# Patient Record
Sex: Female | Born: 1965 | Race: Black or African American | Hispanic: No | Marital: Single | Smoking: Never smoker
Health system: Southern US, Community
[De-identification: ages and names within clinical notes are randomized; demographics above are authoritative.]

## PROBLEM LIST (undated history)

## (undated) DIAGNOSIS — M1990 Unspecified osteoarthritis, unspecified site: Secondary | ICD-10-CM

---

## 1999-10-22 ENCOUNTER — Encounter: Payer: Self-pay | Admitting: Emergency Medicine

## 1999-10-22 ENCOUNTER — Emergency Department (HOSPITAL_COMMUNITY): Admission: EM | Admit: 1999-10-22 | Discharge: 1999-10-22 | Payer: Self-pay | Admitting: Emergency Medicine

## 2004-06-10 DIAGNOSIS — M1990 Unspecified osteoarthritis, unspecified site: Secondary | ICD-10-CM

## 2004-06-10 HISTORY — DX: Unspecified osteoarthritis, unspecified site: M19.90

## 2005-04-15 ENCOUNTER — Emergency Department (HOSPITAL_COMMUNITY): Admission: EM | Admit: 2005-04-15 | Discharge: 2005-04-15 | Payer: Self-pay | Admitting: Emergency Medicine

## 2005-04-19 ENCOUNTER — Ambulatory Visit (HOSPITAL_COMMUNITY): Admission: RE | Admit: 2005-04-19 | Discharge: 2005-04-19 | Payer: Self-pay | Admitting: Emergency Medicine

## 2005-04-19 ENCOUNTER — Emergency Department (HOSPITAL_COMMUNITY): Admission: EM | Admit: 2005-04-19 | Discharge: 2005-04-19 | Payer: Self-pay | Admitting: Emergency Medicine

## 2012-01-01 ENCOUNTER — Emergency Department (HOSPITAL_COMMUNITY): Payer: Self-pay

## 2012-01-01 ENCOUNTER — Encounter (HOSPITAL_COMMUNITY): Payer: Self-pay | Admitting: *Deleted

## 2012-01-01 ENCOUNTER — Encounter (HOSPITAL_COMMUNITY): Payer: Self-pay

## 2012-01-01 ENCOUNTER — Emergency Department (HOSPITAL_COMMUNITY)
Admission: EM | Admit: 2012-01-01 | Discharge: 2012-01-01 | Disposition: A | Discharge status: Home / Self Care | Payer: Self-pay | Attending: Emergency Medicine | Admitting: Emergency Medicine

## 2012-01-01 ENCOUNTER — Emergency Department (INDEPENDENT_AMBULATORY_CARE_PROVIDER_SITE_OTHER)
Admission: EM | Admit: 2012-01-01 | Discharge: 2012-01-01 | Disposition: A | Discharge status: Nursing Home (Includes ICF) | Payer: Self-pay | Source: Home / Self Care

## 2012-01-01 DIAGNOSIS — R0602 Shortness of breath: Secondary | ICD-10-CM | POA: Insufficient documentation

## 2012-01-01 DIAGNOSIS — M76899 Other specified enthesopathies of unspecified lower limb, excluding foot: Secondary | ICD-10-CM | POA: Insufficient documentation

## 2012-01-01 DIAGNOSIS — M79659 Pain in unspecified thigh: Secondary | ICD-10-CM

## 2012-01-01 DIAGNOSIS — R220 Localized swelling, mass and lump, head: Secondary | ICD-10-CM | POA: Insufficient documentation

## 2012-01-01 DIAGNOSIS — M79609 Pain in unspecified limb: Secondary | ICD-10-CM

## 2012-01-01 DIAGNOSIS — M7070 Other bursitis of hip, unspecified hip: Secondary | ICD-10-CM

## 2012-01-01 HISTORY — DX: Unspecified osteoarthritis, unspecified site: M19.90

## 2012-01-01 LAB — POCT I-STAT, CHEM 8
Calcium, Ion: 1.16 mmol/L (ref 1.12–1.23)
Creatinine, Ser: 1 mg/dL (ref 0.50–1.10)
Glucose, Bld: 96 mg/dL (ref 70–99)
HCT: 42 % (ref 36.0–46.0)
Hemoglobin: 14.3 g/dL (ref 12.0–15.0)

## 2012-01-01 MED ORDER — PREDNISONE 20 MG PO TABS
60.0000 mg | ORAL_TABLET | Freq: Once | ORAL | Status: AC
  Administered 2012-01-01: 60 mg via ORAL
  Filled 2012-01-01: qty 3

## 2012-01-01 MED ORDER — PREDNISONE 20 MG PO TABS: ORAL_TABLET | ORAL | Status: AC

## 2012-01-01 NOTE — ED Provider Notes (Signed)
History     CSN: [id number]  Arrival date & time 01/01/12  1718   None     Chief Complaint  Patient presents with  . Leg Pain    (Consider location/radiation/quality/duration/timing/severity/associated sxs/prior treatment) Patient is a 46 y.o. female presenting with leg pain. The history is provided by the patient.  Leg Pain   Patient complains of right lateral thigh pain ongoing since 2006 but has gotten increasingly worse this week.  Previously considered to have a DVT but for some reason she never had doppler studies completed as ordered.  Reports pain is worse with palpation, associated lower extremity edema, no pain with ambulation.  Denies neurovascular symptoms, no change in leg function, but admits to ambulating with limp.  History reviewed. No pertinent past medical history.  History reviewed. No pertinent past surgical history.  No family history on file.  History  Substance Use Topics  . Smoking status: Never Smoker   . Smokeless tobacco: Not on file  . Alcohol Use: No    OB History    Grav Para Term Preterm Abortions TAB SAB Ect Mult Living                  Review of Systems  All other systems reviewed and are negative.    Allergies  Aspirin and Codeine  Home Medications  No current outpatient prescriptions on file.  BP 199/118  Pulse 96  Temp 97.9 F (36.6 C) (Oral)  Resp 16  SpO2 100%  LMP 12/29/2011  Physical Exam  Nursing note and vitals reviewed. Constitutional: She is oriented to person, place, and time. Vital signs are normal. She appears well-developed and well-nourished. She is active and cooperative.  HENT:  Head: Normocephalic.  Eyes: Conjunctivae are normal. Pupils are equal, round, and reactive to light. No scleral icterus.  Neck: Trachea normal. Neck supple.  Cardiovascular: Normal rate and regular rhythm.   Pulmonary/Chest: Effort normal and breath sounds normal.  Musculoskeletal:       Right hip: Normal.       Right  knee: Normal.       Left knee: Normal.       Right ankle: She exhibits swelling.       Left ankle: She exhibits swelling.       Right upper leg: She exhibits tenderness.       Right lower leg: She exhibits edema.       Left lower leg: She exhibits edema.       Tenderness with  Superficial thrombo/cordlike vein on right lateral thigh, nonpitting edema in lower extremities, rubor, sensation intact distally  Neurological: She is alert and oriented to person, place, and time. No cranial nerve deficit or sensory deficit. GCS eye subscore is 4. GCS verbal subscore is 5. GCS motor subscore is 6.  Skin: Skin is warm and dry.  Psychiatric: She has a normal mood and affect. Her speech is normal and behavior is normal. Judgment and thought content normal. Cognition and memory are normal.    ED Course  Procedures (including critical care time)  Labs Reviewed - No data to display No results found.   1. Thigh pain       MDM  Cannot rule out DVT, transfer to [facility] for further evaluation.        Johnsie Kindred, NP 01/01/12 1936

## 2012-01-01 NOTE — ED Provider Notes (Addendum)
History     CSN: [id number]  Arrival date & time 01/01/12  1958   None     Chief Complaint  Patient presents with  . Leg Pain    (Consider location/radiation/quality/duration/timing/severity/associated sxs/prior treatment) HPI Comments: Patient states, that for the past 5, years.  She's had posterior right thigh.  Pain.  That radiates laterally, and anterior.  She was seen 5 years ago for this and told it was joint inflammation and prescribed.  Aleve, for, which she's been taking every day.  Since, then she's noticed in the past, "little bit" but the pain is getting worse is constant but made worse with ambulation.  She denies back pain.  Knee pain, recent surgeries, recent travel, shortness of breath but she did note that on Saturday, which was 4, days ago, that she had bilateral foot swelling.  That lasted one day and facial swelling.  When she first awakens in the morning.  That resolved by noon.  She had no further episodes of swelling.  She does not endorse, shortness of breath, chest pain, weight gain or weight loss.  Traumatic injury.   Patient is a 46 y.o. female presenting with leg pain. The history is provided by the patient.  Leg Pain  The incident occurred more than 1 week ago. There was no injury mechanism. The pain is present in the right thigh. The pain is at a severity of 6/10. The pain is moderate. The pain has been constant since onset. Pertinent negatives include no numbness, no inability to bear weight, no loss of motion, no muscle weakness and no loss of sensation.    Past Medical History  Diagnosis Date  . Joint inflammation 2006    History reviewed. No pertinent past surgical history.  No family history on file.  History  Substance Use Topics  . Smoking status: Never Smoker   . Smokeless tobacco: Not on file  . Alcohol Use: No    OB History    Grav Para Term Preterm Abortions TAB SAB Ect Mult Living                  Review of Systems    Constitutional: Positive for activity change. Negative for fever.  Musculoskeletal: Positive for joint swelling. Negative for myalgias.  Skin: Negative for rash and wound.  Neurological: Negative for numbness.    Allergies  Aspirin and Codeine  Home Medications   Current Outpatient Rx  Name Route Sig Dispense Refill  . NAPROXEN SODIUM 220 MG PO TABS Oral Take 220 mg by mouth daily as needed. For joint pain    . PREDNISONE 20 MG PO TABS  3 Tabs PO Days 1-3, then 2 tabs PO Days 4-6, then 1 tab PO Day 7-9, then Half Tab PO Day 10-12 20 tablet 0    BP 163/102  Pulse 91  Temp 98.3 F (36.8 C) (Oral)  Resp 16  SpO2 81%  LMP 12/29/2011  Physical Exam  Constitutional: She appears well-developed.  HENT:  Head: Normocephalic.  Neck: Normal range of motion.  Cardiovascular: Normal rate.   Pulmonary/Chest: Effort normal.  Abdominal: Soft.  Musculoskeletal: Normal range of motion.       Pain over R hip bursitis  Neurological: She is alert.  Skin: Skin is warm.    ED Course  Procedures (including critical care time)   Labs Reviewed  POCT I-STAT, CHEM 8   Dg Chest 2 View  01/01/2012  *RADIOLOGY REPORT*  Clinical Data: Shortness of breath, facial  swelling  CHEST - 2 VIEW  Comparison: None.  Findings: Lungs are essentially clear. No pleural effusion or pneumothorax.  Cardiomediastinal silhouette is within normal limits.  Degenerative changes of the visualized thoracolumbar spine.  IMPRESSION: No evidence of acute cardiopulmonary disease.  Original Report Authenticated By: Charline Bills, M.D.   Dg Femur Right  01/01/2012  *RADIOLOGY REPORT*  Clinical Data: Right femur pain  RIGHT FEMUR - 2 VIEW  Comparison: 04/15/2005  Findings: No fracture or dislocation is seen.  Mild to moderate degenerative changes of the right hip.  Visualized soft tissues are unremarkable.  IMPRESSION: No fracture or dislocation is seen.  Mild to moderate degenerative changes of the right hip.  Original  Report Authenticated By: Charline Bills, M.D.     1. Bursitis of hip       MDM  I have reviewed the X-rays, which show degenerative changes in the hip.  Her exam is consistent with a bursitis Her kidney function, and chest x-ray, are normal       Arman Filter, NP 01/01/12 2305  Arman Filter, NP 01/01/12 2305  Arman Filter, NP 01/15/12 2006

## 2012-01-01 NOTE — ED Notes (Signed)
Patient transported to X-ray 

## 2012-01-01 NOTE — ED Notes (Addendum)
C/o rt posterior thigh pain.  States this has been bothering her since 2006. States she was seen in ED for this 04/2005 and told it was inflammation.  Reports she needs to work but pain is worse with ambulation and standing.  Denies recent injury.  BP is elevated today- denies hx of HTN.  Denies chest pain, SOB, dizziness or headache.  States she has had some swelling of her feet and ankles recently.

## 2012-01-01 NOTE — ED Notes (Signed)
Pt was dx in [facility] with "joint inflammation" in 2006 r/t R thigh.  Pt has been taking aleve since then.  Pt went to UC today b/c the pain began getting unbearable and her feet started swelling Sat.  Presently no pedal edema.  Pt denies sob.

## 2012-01-02 NOTE — ED Provider Notes (Signed)
Medical screening examination/treatment/procedure(s) were performed by non-physician practitioner and as supervising physician I was immediately available for consultation/collaboration.   Carleene Cooper III, MD 01/02/12 [phone]

## 2012-01-02 NOTE — ED Provider Notes (Signed)
Medical screening examination/treatment/procedure(s) were performed by non-physician practitioner and as supervising physician I was immediately available for consultation/collaboration.Case was transferred to ED.   Raynald Blend, MD 01/02/12 1034

## 2012-01-15 NOTE — ED Provider Notes (Signed)
Medical screening examination/treatment/procedure(s) were performed by non-physician practitioner and as supervising physician I was immediately available for consultation/collaboration.   Carleene Cooper III, MD 01/15/12 2157

## 2014-03-14 IMAGING — CR DG CHEST 2V
2 series · 2 of 2 positions shown · non-contrast
Comparison: None.

CLINICAL DATA: Shortness of breath, facial swelling

CHEST - 2 VIEW

[w chest pa]
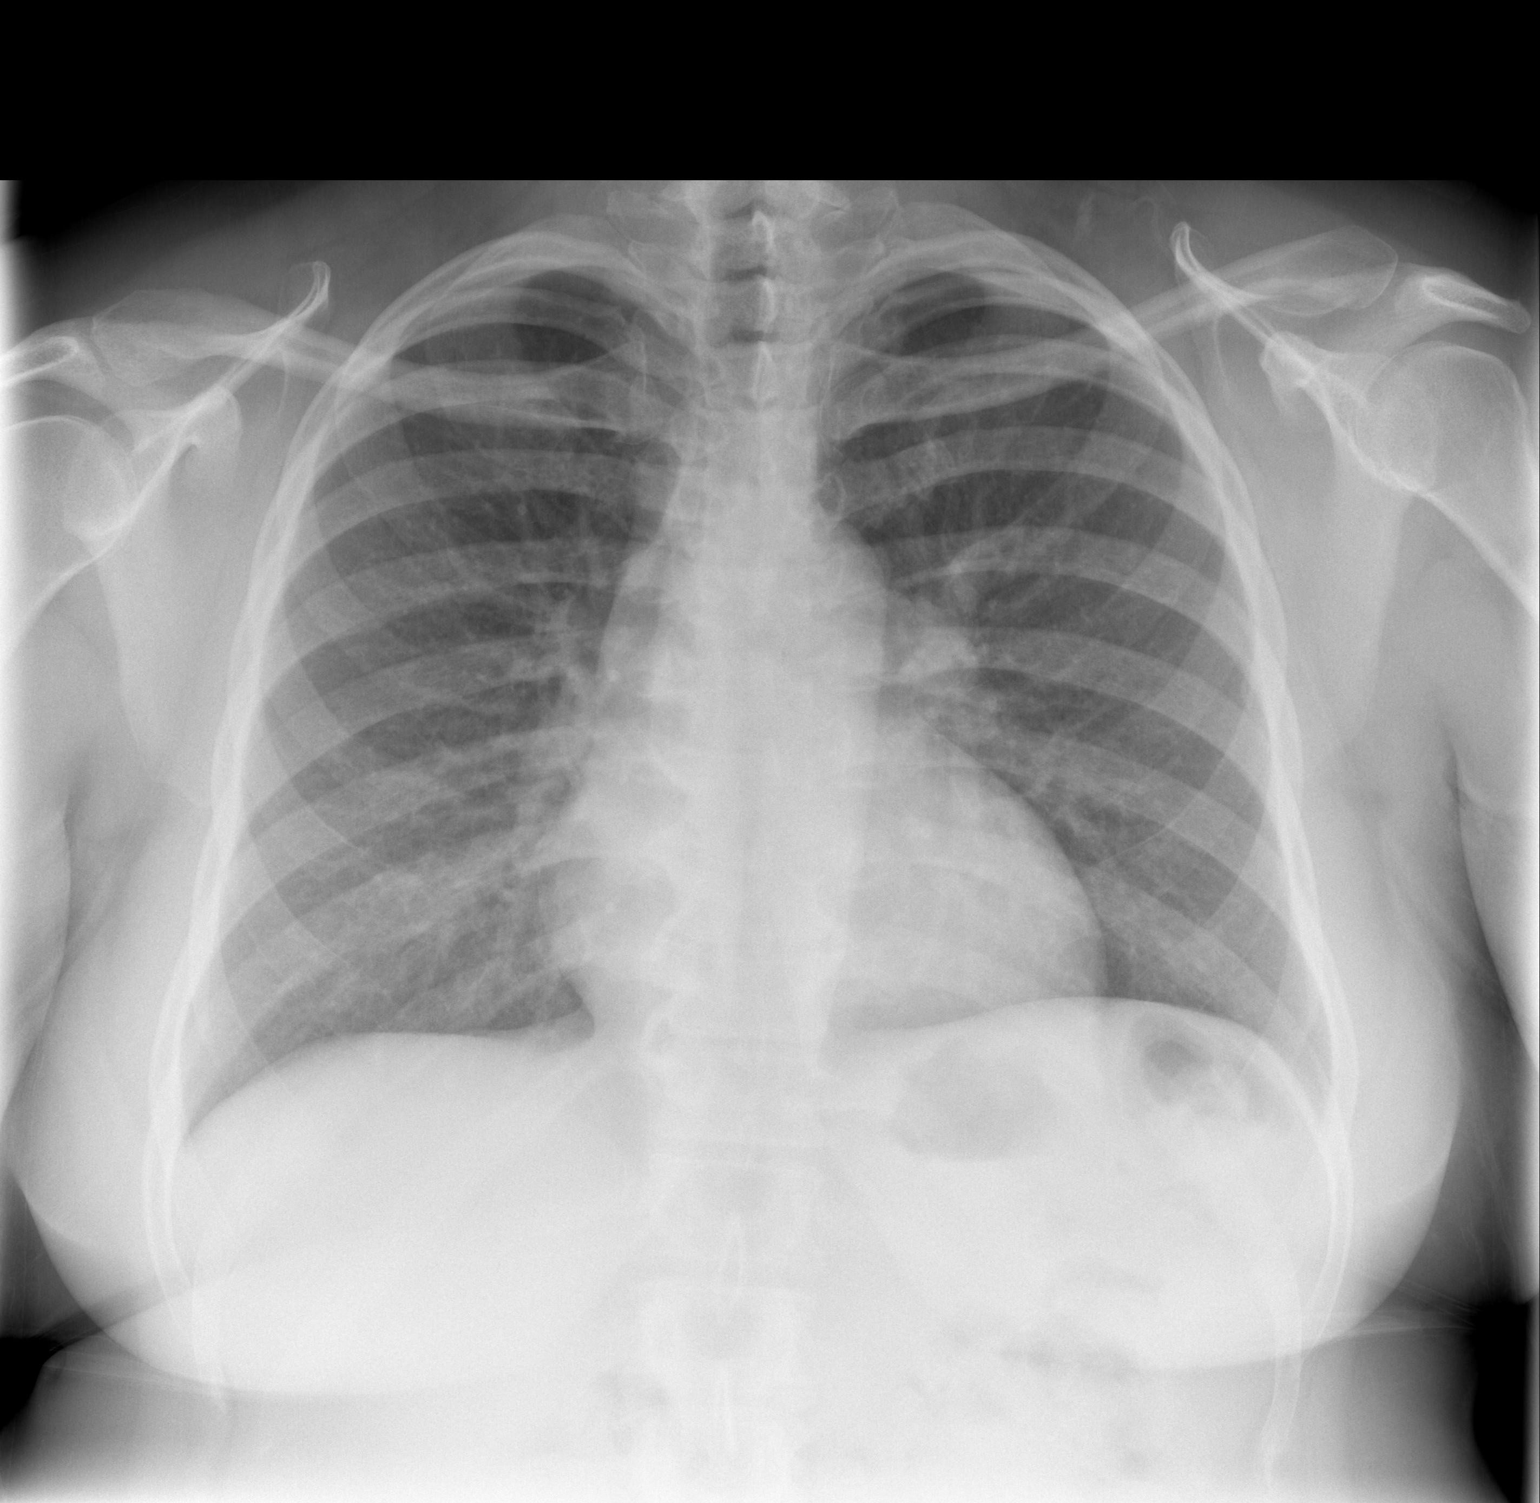

[w chest lat]
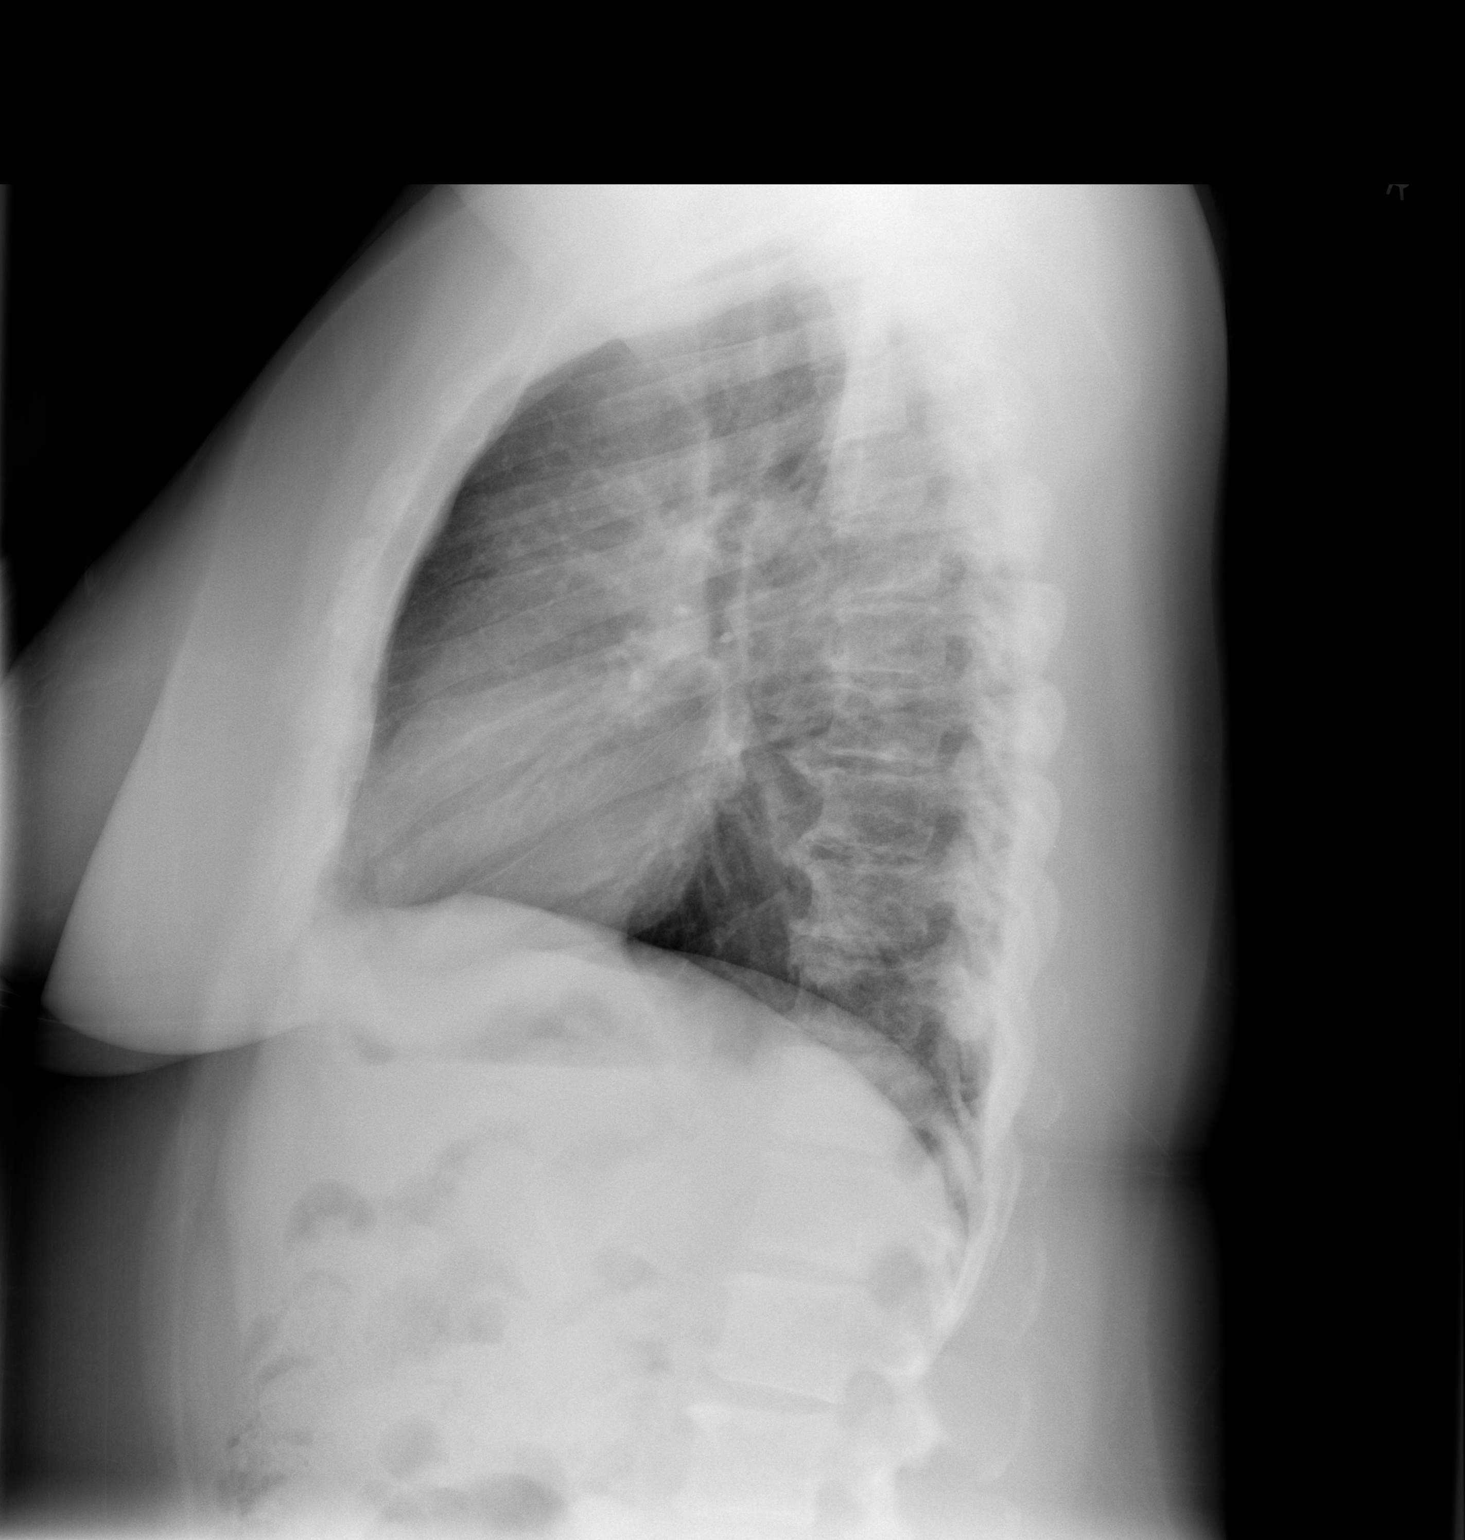

[2 of 2 positions shown; findings below may reference images not displayed]

FINDINGS: Lungs are essentially clear. No pleural effusion or
pneumothorax.

Cardiomediastinal silhouette is within normal limits.

Degenerative changes of the visualized thoracolumbar spine.
IMPRESSION: No evidence of acute cardiopulmonary disease.

## 2014-03-15 ENCOUNTER — Emergency Department (HOSPITAL_COMMUNITY): Payer: Self-pay

## 2014-03-15 ENCOUNTER — Emergency Department (HOSPITAL_COMMUNITY)
Admission: EM | Admit: 2014-03-15 | Discharge: 2014-03-15 | Disposition: A | Discharge status: Home / Self Care | Payer: Self-pay | Attending: Emergency Medicine | Admitting: Emergency Medicine

## 2014-03-15 ENCOUNTER — Encounter (HOSPITAL_COMMUNITY): Payer: Self-pay | Admitting: Emergency Medicine

## 2014-03-15 DIAGNOSIS — Y939 Activity, unspecified: Secondary | ICD-10-CM | POA: Insufficient documentation

## 2014-03-15 DIAGNOSIS — Z791 Long term (current) use of non-steroidal anti-inflammatories (NSAID): Secondary | ICD-10-CM | POA: Insufficient documentation

## 2014-03-15 DIAGNOSIS — M7071 Other bursitis of hip, right hip: Secondary | ICD-10-CM | POA: Insufficient documentation

## 2014-03-15 DIAGNOSIS — M719 Bursopathy, unspecified: Secondary | ICD-10-CM

## 2014-03-15 LAB — I-STAT CHEM 8, ED
BUN: 8 mg/dL (ref 6–23)
CHLORIDE: 102 meq/L (ref 96–112)
CREATININE: 0.8 mg/dL (ref 0.50–1.10)
Calcium, Ion: 1.2 mmol/L (ref 1.12–1.23)
Glucose, Bld: 103 mg/dL — ABNORMAL HIGH (ref 70–99)
HCT: 42 % (ref 36.0–46.0)
HEMOGLOBIN: 14.3 g/dL (ref 12.0–15.0)
POTASSIUM: 3.4 meq/L — AB (ref 3.7–5.3)
SODIUM: 138 meq/L (ref 137–147)
TCO2: 24 mmol/L (ref 0–100)

## 2014-03-15 MED ORDER — HYDROCODONE-ACETAMINOPHEN 5-325 MG PO TABS
1.0000 | ORAL_TABLET | ORAL | Status: AC | PRN
Start: 2014-03-15 — End: ?

## 2014-03-15 MED ORDER — HYDROCODONE-ACETAMINOPHEN 5-325 MG PO TABS
1.0000 | ORAL_TABLET | Freq: Once | ORAL | Status: AC
  Administered 2014-03-15: 1 via ORAL
  Filled 2014-03-15: qty 1

## 2014-03-15 MED ORDER — IBUPROFEN 400 MG PO TABS
400.0000 mg | ORAL_TABLET | Freq: Once | ORAL | Status: AC
  Administered 2014-03-15: 400 mg via ORAL
  Filled 2014-03-15: qty 1

## 2014-03-15 NOTE — ED Provider Notes (Signed)
CSN: [id number]     Arrival date & time 03/15/14  1313 History  This chart was scribed for non-physician practitioner Harle Battiest working with Gilda Crease, * by Conchita Paris, ED Scribe. This patient was seen in TR07C/TR07C and the patient's care was started at 2:07 PM.     Chief Complaint  Patient presents with  . Hip Pain   Patient is a 48 y.o. female presenting with hip pain. The history is provided by the patient. No language interpreter was used.  Hip Pain Pertinent negatives include no chest pain and no shortness of breath.   HPI Comments: Deeandra Jerry is a 48 y.o. female who presents to the Emergency Department complaining of gradually worsening, chronic, non-radiating right hip pain that his been ongoing since November 2007. This current episode gradually worsened over the last week. She describes the pain as throbbing and rates it as 9/10. She denies any falling. She tried tylenol, aleve and using an ice pack but she denies any relief. She denies CP, SOB, edema, incontinence, numbness, fever and chills.   Pt does not have a PCP and has never received treatment for HTN. Past Medical History  Diagnosis Date  . Joint inflammation 2006   History reviewed. No pertinent past surgical history. No family history on file. History  Substance Use Topics  . Smoking status: Never Smoker   . Smokeless tobacco: Not on file  . Alcohol Use: No   OB History   Grav Para Term Preterm Abortions TAB SAB Ect Mult Living                 Review of Systems  Constitutional: Negative for fever and chills.  Respiratory: Negative for shortness of breath.   Cardiovascular: Negative for chest pain and leg swelling.  Musculoskeletal: Positive for arthralgias.  Neurological: Negative for numbness.  All other systems reviewed and are negative.  Allergies  Aspirin and Codeine  Home Medications   Prior to Admission medications   Medication Sig Start Date End Date Taking?  Authorizing Provider  naproxen sodium (ANAPROX) 220 MG tablet Take 220 mg by mouth daily as needed. For joint pain    Historical Provider, MD   BP 213/101  Pulse 119  Temp(Src) 98.6 F (37 C) (Oral)  Resp 20  SpO2 100%  LMP 12/29/2011 Physical Exam  Nursing note and vitals reviewed. Constitutional: She is oriented to person, place, and time. She appears well-developed and well-nourished. No distress.  HENT:  Head: Normocephalic and atraumatic.  Eyes: EOM are normal.  Neck: Normal range of motion.  Cardiovascular: Normal rate.   Pulses:      Posterior tibial pulses are 2+ on the right side, and 2+ on the left side.  Pulmonary/Chest: Effort normal.  Musculoskeletal:  No cervical, thoraric, lumbar tenderness. TTP over the trochanteric bursa. TTP right lateral thigh. Mildly tender on right posterior and anterior thigh. Crepitus palpated in her right knee   Neurological: She is alert and oriented to person, place, and time.  5/5 dorsi plantar flexion  5/5 strength with straight leg raise.   Skin: Skin is warm and dry.  Psychiatric: She has a normal mood and affect. Her behavior is normal.    ED Course  Procedures  DIAGNOSTIC STUDIES: Oxygen Saturation is 100% on room air, normal by my interpretation.    COORDINATION OF CARE: 2:15 PM- Will refer pt to orthopedist and a PCP. PT agreed to treatment plan.  Labs Review Labs Reviewed - No data to display  Imaging Review  DG Hip Complete Right (Final result)  Result time: 03/15/14 15:28:01    Final result by Rad Results In Interface (03/15/14 15:28:01)    Narrative:   CLINICAL DATA: Right hip pain, no known injury  EXAM: RIGHT HIP - COMPLETE 2+ VIEW  COMPARISON: 01/01/2012  FINDINGS: Three views of the right hip submitted. No acute fracture. There is progression of degenerative changes with narrowing of superior joint space. Cystic and sclerotic changes are noted femoral head and right superior acetabulum. Mild  spurring of right superior acetabulum. There is spurring of femoral head.  IMPRESSION: No acute fracture or subluxation. Progression of osteoarthritic changes as described above.     EKG Interpretation None      MDM   Final diagnoses:  Bursitis   48 yo female presents with continued hip pain, tender to palpation over bursa. Xray negative for acute abnormality. Istat 8 checked to eval renal function and without abnormality. Pain improved in the ED. Pt able to ambulate in ED without difficulty. Hemodynamically stable in NAD prior to dc. Discharge instructions include heat therapy, stretching, and NSAID use. Referral to follow-up with ortho if symptoms persist. Pt in agreement.  Return precautions provided.  Filed Vitals:   03/15/14 1318 03/15/14 1338 03/15/14 1614  BP: 213/101 180/113 158/101  Pulse: 119 112 96  Temp: 98.6 F (37 C)  98.6 F (37 C)  TempSrc: Oral  Oral  Resp: 20 20 12   SpO2: 100% 100% 100%   Meds given in ED:  Medications  HYDROcodone-acetaminophen (NORCO/VICODIN) 5-325 MG per tablet 1 tablet (1 tablet Oral Given 03/15/14 1428)  ibuprofen (ADVIL,MOTRIN) tablet 400 mg (400 mg Oral Given 03/15/14 1428)    New Prescriptions   No medications on file   I personally performed the services described in this documentation, which was scribed in my presence. The recorded information has been reviewed and is accurate.       Harle BattiestElizabeth Humaira Sculley, NP 03/19/14 1026

## 2014-03-15 NOTE — Discharge Instructions (Signed)
Please follow the directions provided. Be sure to establish care with a primary care provider to manage your blood pressure in any other health concerns. Be sure to followup with the orthopedic referral provided to manage this hip pain. You may take ibuprofen 400 mg by mouth every 6 hours from mild to moderate pain. You may take the Vicodin for pain not relieved by the ibuprofen. Don't hesitate to return for any new, worsening or concerning symptoms.   SEEK IMMEDIATE MEDICAL CARE IF:  Your pain increases even during treatment, or you are not improving.  You have a fever.  You have heat and inflammation over the involved bursa.  You have any other questions or concerns.

## 2014-03-15 NOTE — ED Notes (Signed)
Pt has been having pain to her right hip, chronic, no new injury, just states she had a hard time getting up from the chair. Dx with bursitis last time she was here. Told to take tylenol and it hasn't been working.

## 2014-03-20 NOTE — ED Provider Notes (Signed)
Medical screening examination/treatment/procedure(s) were performed by non-physician practitioner and as supervising physician I was immediately available for consultation/collaboration.  Gilda Creasehristopher J. Cierria Height, MD 03/20/14 [phone]

## 2015-11-27 ENCOUNTER — Ambulatory Visit: Payer: Medicaid Other | Admitting: Internal Medicine

## 2016-05-26 IMAGING — CR DG HIP COMPLETE 2+V*R*
3 series · 3 of 3 positions shown · non-contrast
Comparison: 01/01/2012

CLINICAL DATA: Right hip pain, no known injury

EXAM:
RIGHT HIP - COMPLETE 2+ VIEW

[t pelvis a.p. *]
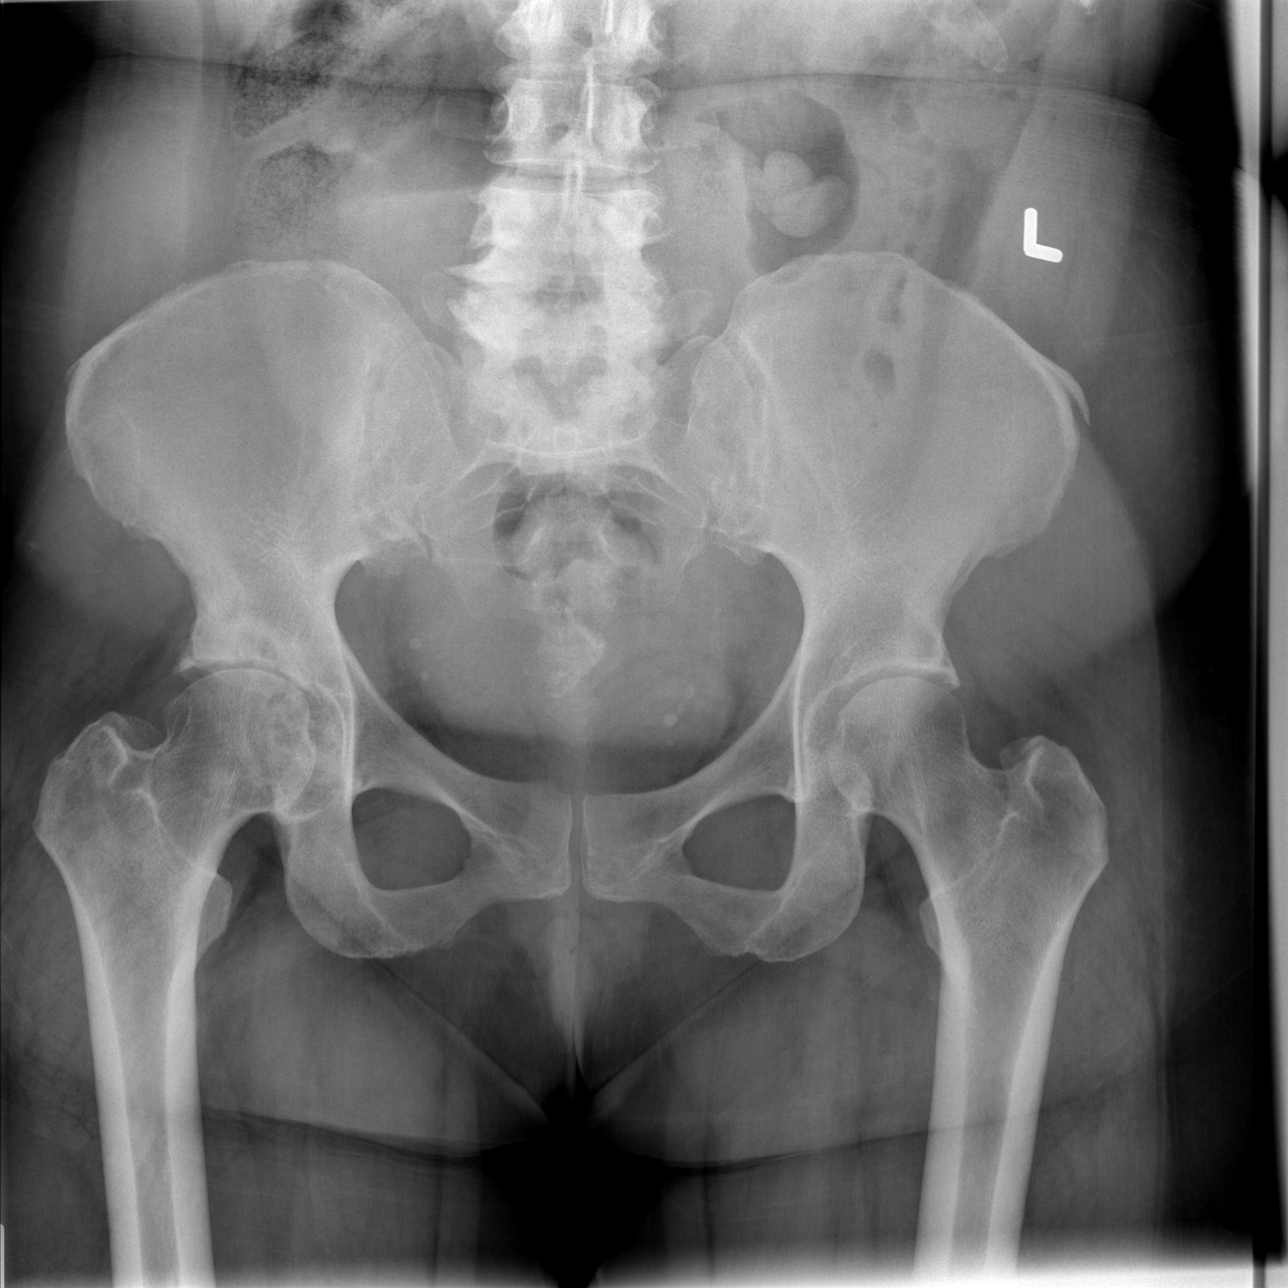

[t hip ap right *]
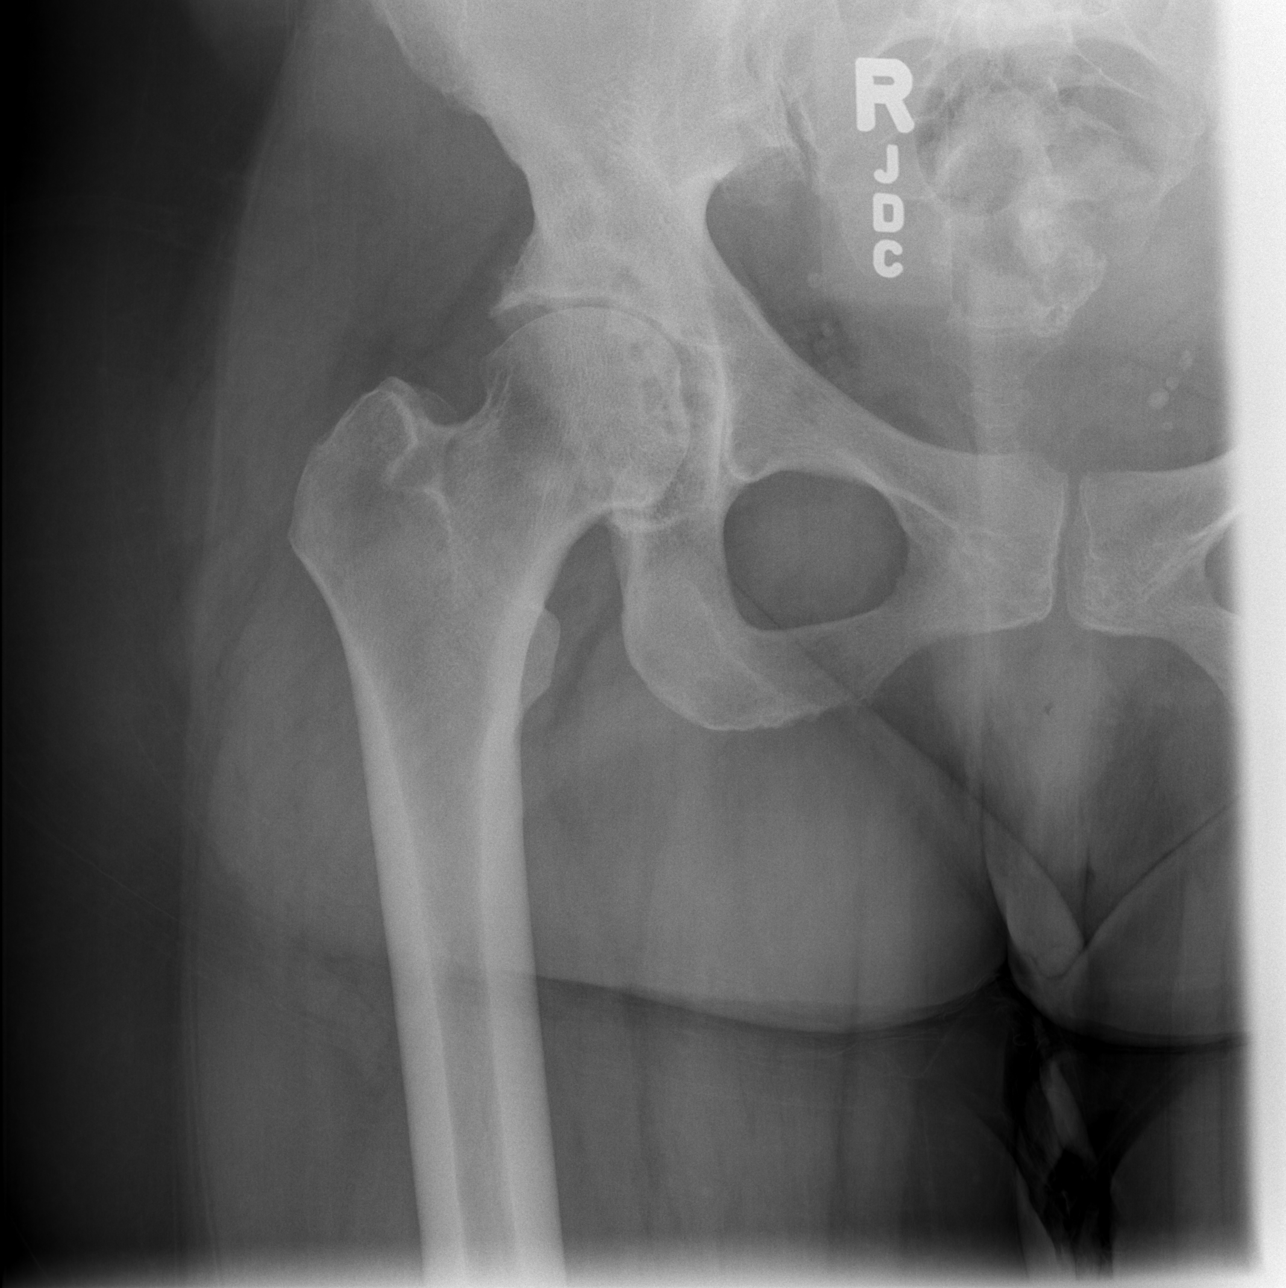

[t hip frog leg right *]
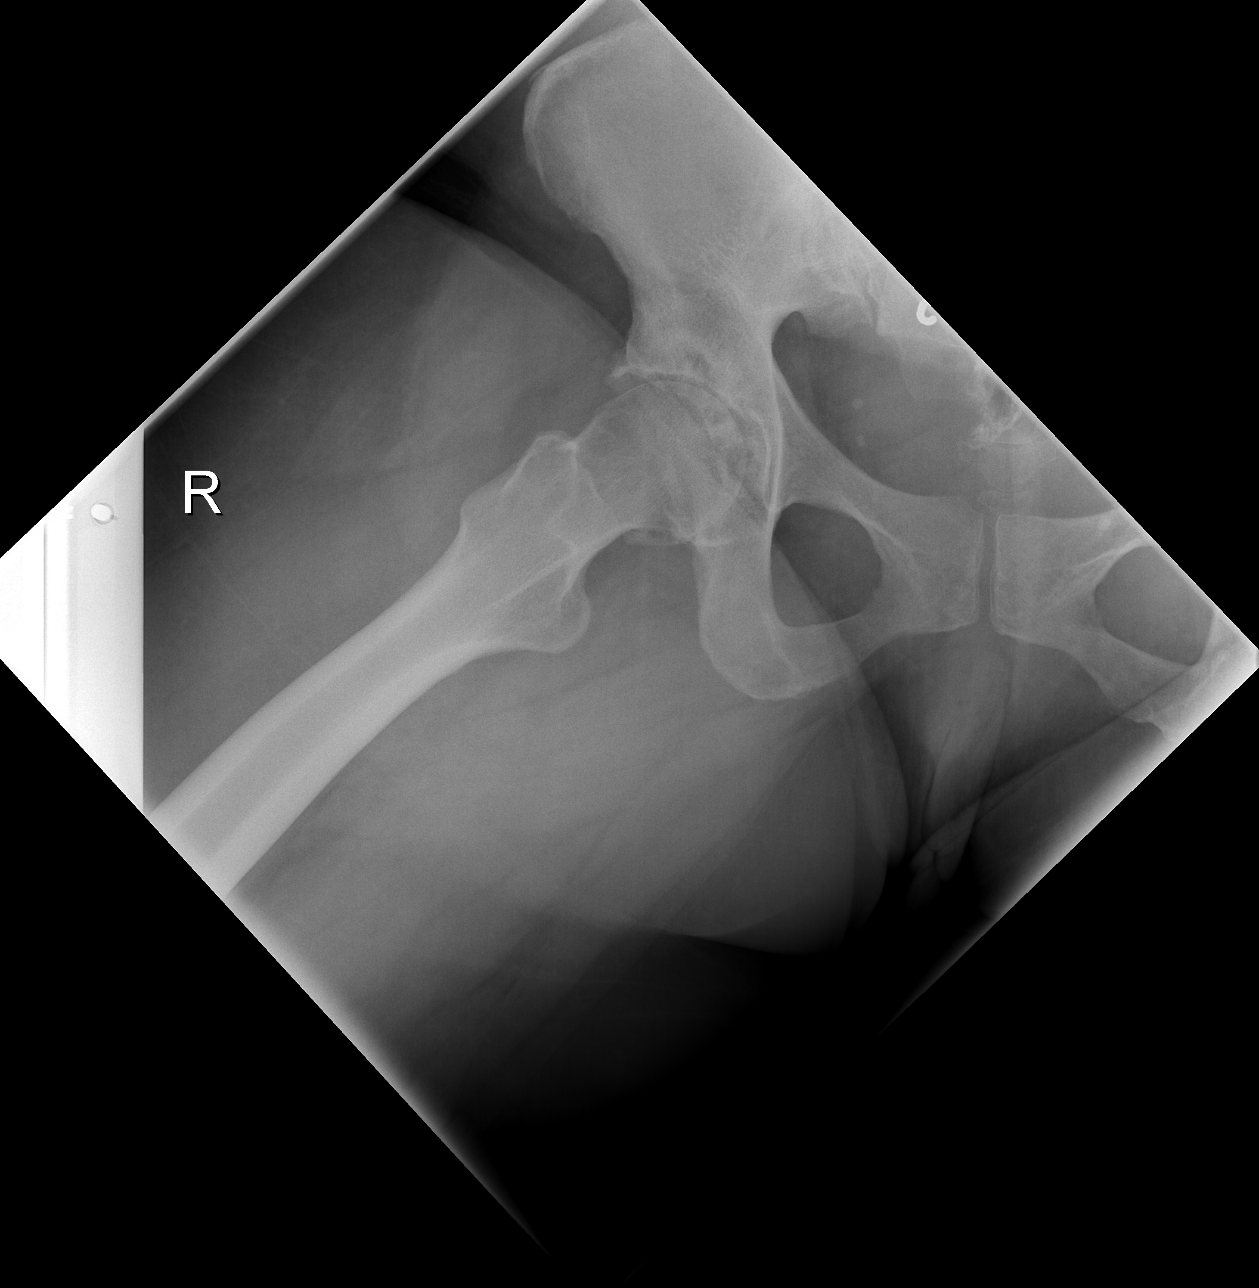

[3 of 3 positions shown; findings below may reference images not displayed]

FINDINGS: Three views of the right hip submitted. No acute fracture. There is
progression of degenerative changes with narrowing of superior joint
space. Cystic and sclerotic changes are noted femoral head and right
superior acetabulum. Mild spurring of right superior acetabulum.
There is spurring of femoral head.
IMPRESSION: No acute fracture or subluxation. Progression of osteoarthritic
changes as described above.
